# Patient Record
Sex: Female | Born: 1975 | Race: White | Hispanic: No | Smoking: Current every day smoker
Health system: Southern US, Community
[De-identification: ages and names within clinical notes are randomized; demographics above are authoritative.]

## PROBLEM LIST (undated history)

## (undated) HISTORY — PX: CHOLECYSTECTOMY: SHX55

## (undated) HISTORY — PX: BREAST ENHANCEMENT SURGERY: SHX7

---

## 2010-10-16 ENCOUNTER — Other Ambulatory Visit: Payer: Self-pay | Admitting: Occupational Medicine

## 2010-10-16 ENCOUNTER — Ambulatory Visit
Admission: RE | Admit: 2010-10-16 | Discharge: 2010-10-16 | Disposition: A | Discharge status: Home / Self Care | Payer: No Typology Code available for payment source | Source: Ambulatory Visit | Attending: Occupational Medicine | Admitting: Occupational Medicine

## 2010-10-16 DIAGNOSIS — Y990 Civilian activity done for income or pay: Secondary | ICD-10-CM

## 2010-10-16 DIAGNOSIS — R52 Pain, unspecified: Secondary | ICD-10-CM

## 2010-10-16 DIAGNOSIS — W19XXXA Unspecified fall, initial encounter: Secondary | ICD-10-CM

## 2010-10-21 ENCOUNTER — Ambulatory Visit
Admission: RE | Admit: 2010-10-21 | Discharge: 2010-10-21 | Disposition: A | Discharge status: Home / Self Care | Payer: No Typology Code available for payment source | Source: Ambulatory Visit | Attending: Occupational Medicine | Admitting: Occupational Medicine

## 2010-10-21 ENCOUNTER — Other Ambulatory Visit: Payer: Self-pay | Admitting: Occupational Medicine

## 2010-10-21 DIAGNOSIS — Z569 Unspecified problems related to employment: Secondary | ICD-10-CM

## 2010-10-21 DIAGNOSIS — M545 Low back pain, unspecified: Secondary | ICD-10-CM

## 2010-12-10 ENCOUNTER — Other Ambulatory Visit: Payer: Self-pay | Admitting: Orthopedic Surgery

## 2010-12-10 DIAGNOSIS — M5410 Radiculopathy, site unspecified: Secondary | ICD-10-CM

## 2010-12-28 ENCOUNTER — Ambulatory Visit
Admission: RE | Admit: 2010-12-28 | Discharge: 2010-12-28 | Disposition: A | Discharge status: Home / Self Care | Payer: No Typology Code available for payment source | Source: Ambulatory Visit | Attending: Orthopedic Surgery | Admitting: Orthopedic Surgery

## 2010-12-28 DIAGNOSIS — M5410 Radiculopathy, site unspecified: Secondary | ICD-10-CM

## 2012-03-15 IMAGING — CR DG LUMBAR SPINE COMPLETE 4+V
5 series · 5 of 5 positions shown · non-contrast
Comparison: None.

CLINICAL DATA: Fall on [DATE], low back pain

LUMBAR SPINE - COMPLETE 4+ VIEW

[view not recorded (1 of 5)]
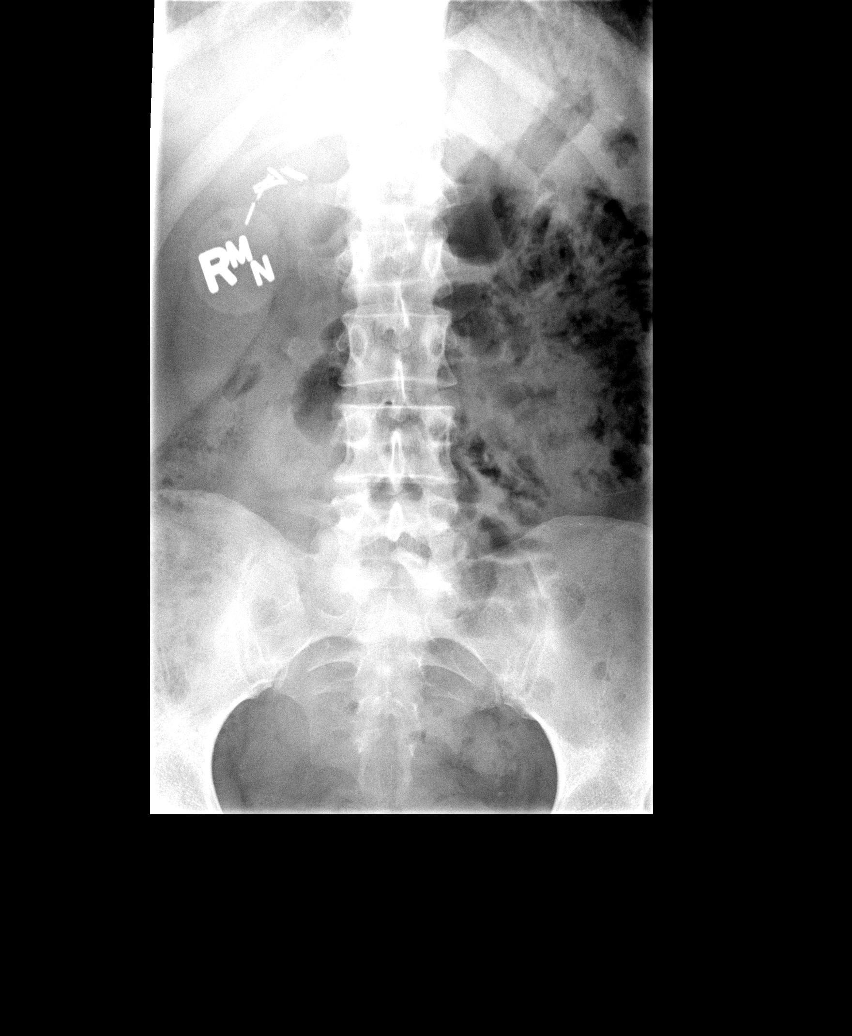

[view not recorded (2 of 5)]
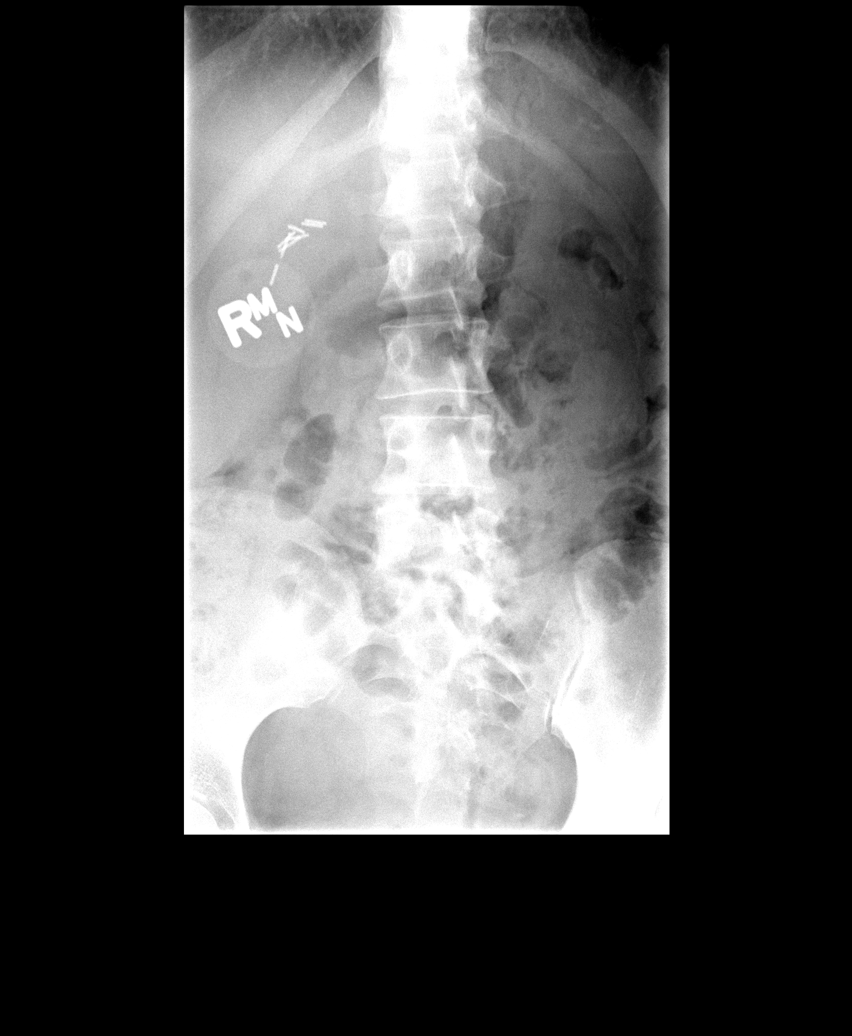

[view not recorded (3 of 5)]
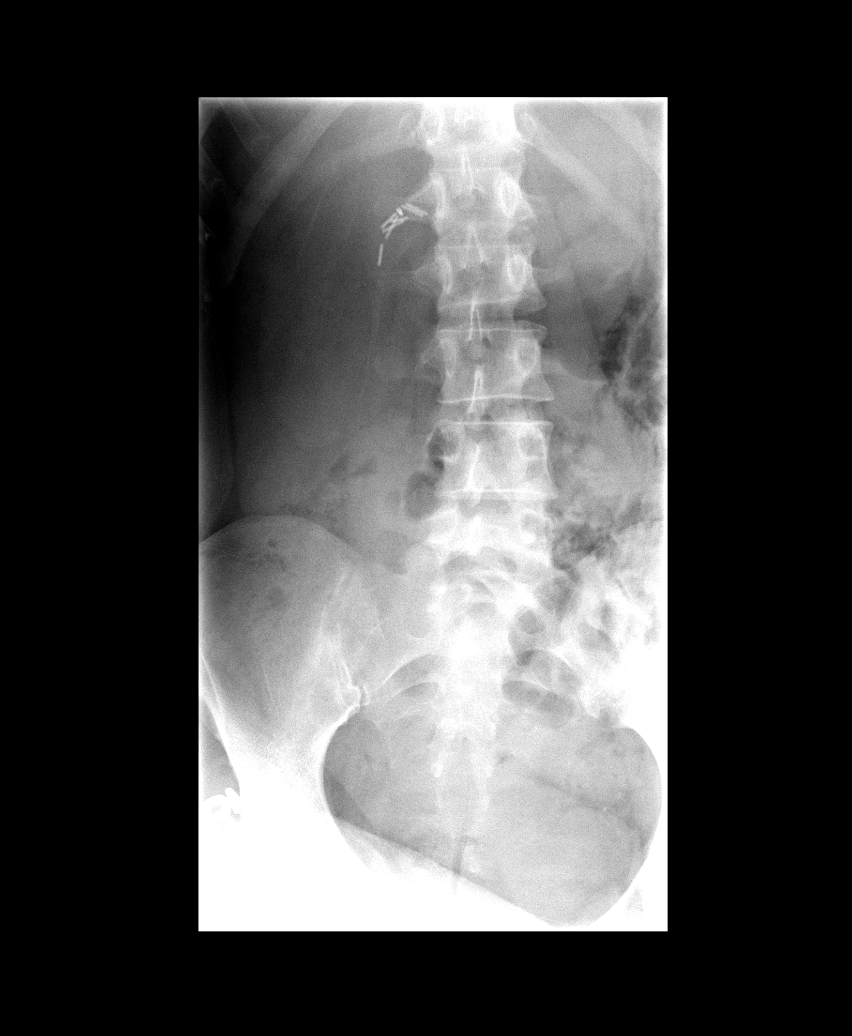

[view not recorded (4 of 5)]
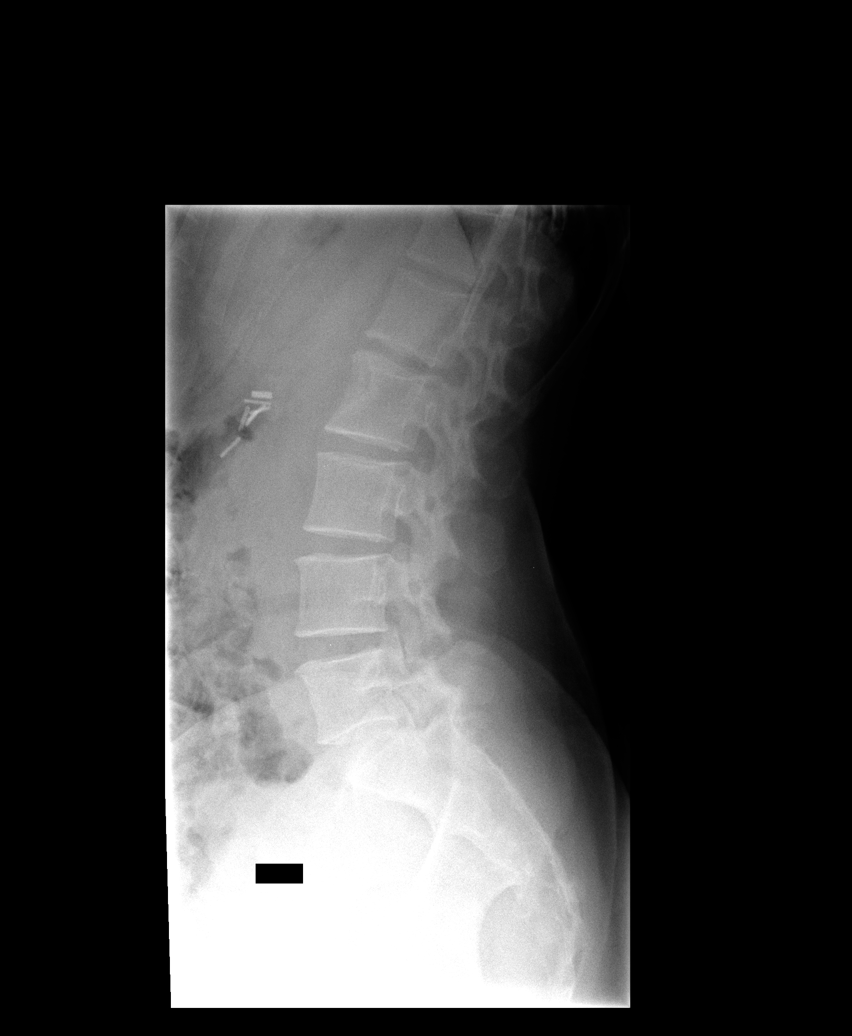

[view not recorded (5 of 5)]
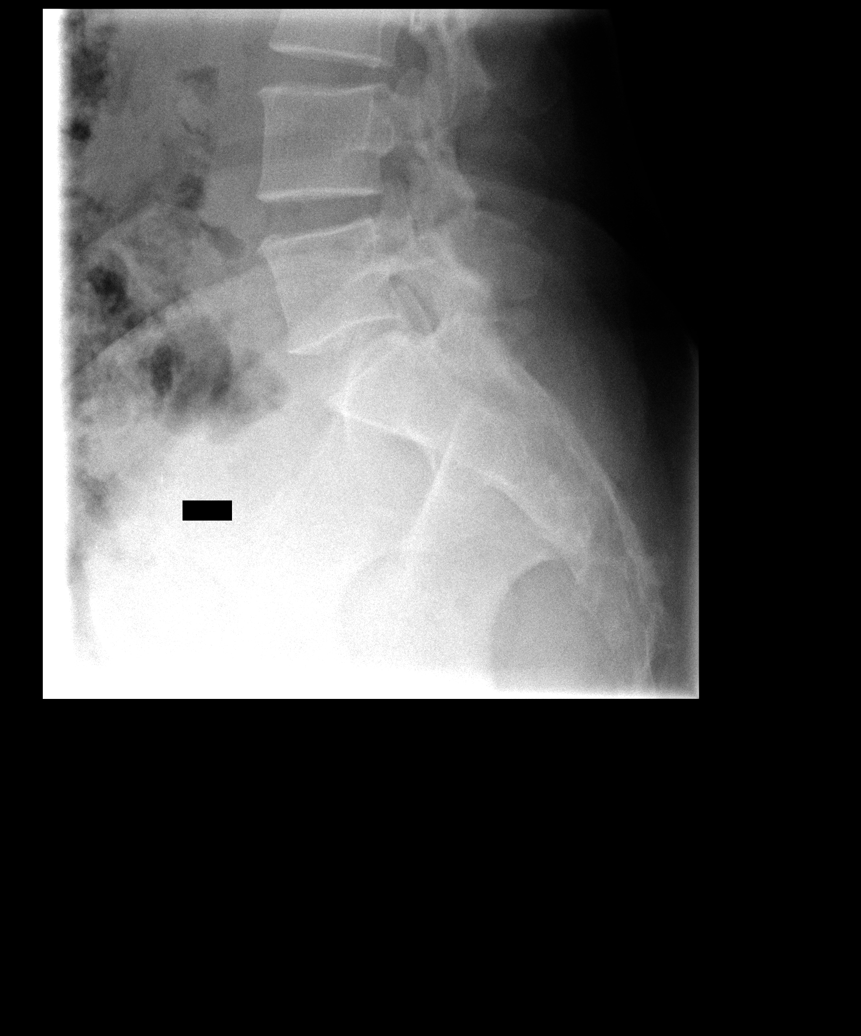

[5 of 5 positions shown; findings below may reference images not displayed]

FINDINGS: Five lumbar-type vertebral bodies.

No evidence of acute fracture or dislocation.

Mild superior endplate changes at L2.  Mild degenerative changes L5-
S1 with possible pars defect.

Cholecystectomy clips.
IMPRESSION: No evidence of acute fracture or dislocation.

Degenerative changes with possible pars defect at L5-S1.

## 2014-03-25 ENCOUNTER — Emergency Department (HOSPITAL_COMMUNITY)
Admission: EM | Admit: 2014-03-25 | Discharge: 2014-03-25 | Disposition: A | Discharge status: Home / Self Care | Payer: No Typology Code available for payment source | Attending: Emergency Medicine | Admitting: Emergency Medicine

## 2014-03-25 ENCOUNTER — Encounter (HOSPITAL_COMMUNITY): Payer: Self-pay | Admitting: Emergency Medicine

## 2014-03-25 DIAGNOSIS — Y998 Other external cause status: Secondary | ICD-10-CM | POA: Insufficient documentation

## 2014-03-25 DIAGNOSIS — Z720 Tobacco use: Secondary | ICD-10-CM | POA: Diagnosis not present

## 2014-03-25 DIAGNOSIS — Y9289 Other specified places as the place of occurrence of the external cause: Secondary | ICD-10-CM | POA: Insufficient documentation

## 2014-03-25 DIAGNOSIS — T7421XA Adult sexual abuse, confirmed, initial encounter: Secondary | ICD-10-CM | POA: Insufficient documentation

## 2014-03-25 DIAGNOSIS — Y9389 Activity, other specified: Secondary | ICD-10-CM | POA: Diagnosis not present

## 2014-03-25 NOTE — ED Notes (Signed)
Sane nurse in with pt at this time.
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
## 2014-03-25 NOTE — Discharge Instructions (Signed)
Sexual Assault or Rape  Sexual assault is any sexual activity that a person is forced, threatened, or coerced into participating in. It may or may not involve physical contact. You are being sexually abused if you are forced to have sexual contact of any kind. Sexual assault is called rape if penetration has occurred (vaginal, oral, or anal). Many times, sexual assaults are committed by a friend, relative, or associate. Sexual assault and rape are never the victim's fault.   Sexual assault can result in various health problems for the person who was assaulted. Some of these problems include:  · Physical injuries in the genital area or other areas of the body.  · Risk of unwanted pregnancy.  · Risk of sexually transmitted infections (STIs).  · Psychological problems such as anxiety, depression, or posttraumatic stress disorder.  WHAT STEPS SHOULD BE TAKEN AFTER A SEXUAL ASSAULT?  If you have been sexually assaulted, you should take the following steps as soon as possible:  · Go to a safe area as quickly as possible and call your local emergency services (911 in [place] Get away from the area where you have been attacked.    · Do not wash, shower, comb your hair, or clean any part of your body.    · Do not change your clothes.    · Do not remove or touch anything in the area where you were assaulted.    · Go to an emergency room for a complete physical exam. Get the necessary tests to protect yourself from STIs or pregnancy. You may be treated for an STI even if no signs of one are present. Emergency contraceptive medicines are also available to help prevent pregnancy, if this is desired. You may need to be examined by a specially trained health care provider.  · Have the health care provider collect evidence during the exam, even if you are not sure if you will file a report with the police.  · Find out how to file the correct papers with the authorities. This is important for all assaults, even if they were committed  by a family member or friend.  · Find out where you can get additional help and support, such as a local rape crisis center.  · Follow up with your health care provider as directed.    HOW CAN YOU REDUCE THE CHANCES OF SEXUAL ASSAULT?  Take the following steps to help reduce your chances of being sexually assaulted:  · Consider carrying mace or pepper spray for protection against an attacker.    · Consider taking a self-defense course.  · Do not try to fight off an attacker if he or she has a gun or knife.    · Be aware of your surroundings, what is happening around you, and who might be there.    · Be assertive, trust your instincts, and walk with confidence and direction.  · Be careful not to drink too much alcohol or use other intoxicants. These can reduce your ability to fight off an assault.  · Always lock your doors and windows. Be sure to have high-quality locks for your home.    · Do not let people enter your house if you do not know them.    · Get a home security system that has a siren if you are able.    · Protect the keys to your house and car. Do not lend them out. Do not put your name and address on them. If you lose them, get your locks changed.    · Always   lock your car and have your key ready to open the door before approaching the car.    · Park in a well-lit and busy area.  · Plan your driving routes so that you travel on well-lit and frequently used streets.   · Keep your car serviced. Always have at least half a tank of gas in it.    · Do not go into isolated areas alone. This includes open garages, empty buildings or offices, or public laundry rooms.    · Do not walk or jog alone, especially when it is dark.    · Never hitchhike.    · If your car breaks down, call the police for help on your cell phone and stay inside the car with your doors locked and windows up.    · If you are being followed, go to a busy area and call for help.    · If you are stopped by a police officer, especially one in  an unmarked police car, keep your door locked. Do not put your window down all the way. Ask the officer to show you identification first.    · Be aware of "date rape drugs" that can be placed in a drink when you are not looking. These drugs can make you unable to fight off an assault.  FOR MORE INFORMATION  · Office on Women's Health, [place] Department of Health and Human Services: www.womenshealth.gov/violence-against-women/types-of-violence/sexual-assault-and-abuse.html  · National Sexual Assault Hotline: 1-800-656-HOPE (4673)  · National Domestic Violence Hotline: 1-800-799-SAFE (7233) or www.thehotline.org  Document Released: 01/04/2000 Document Revised: 09/08/2012 Document Reviewed: 06/09/2012  ExitCare® Patient Information ©2015 [employer]. This information is not intended to replace advice given to you by your health care provider. Make sure you discuss any questions you have with your health care provider.
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
## 2014-03-25 NOTE — ED Notes (Signed)
Sane nurse notified that pt has been medically cleared.  Sane nurse st's she will be here in approx. 45 minutes.  Pt made aware of same

## 2014-03-25 NOTE — ED Notes (Signed)
Contact sane nurse after pt has been medically cleared.

## 2014-04-10 ENCOUNTER — Encounter: Payer: No Typology Code available for payment source | Admitting: Obstetrics & Gynecology

## 2018-09-09 ENCOUNTER — Other Ambulatory Visit: Payer: Self-pay

## 2018-09-09 DIAGNOSIS — Z20822 Contact with and (suspected) exposure to covid-19: Secondary | ICD-10-CM

## 2018-09-10 LAB — NOVEL CORONAVIRUS, NAA: SARS-CoV-2, NAA: NOT DETECTED

## 2018-09-13 ENCOUNTER — Telehealth: Payer: Self-pay | Admitting: General Practice

## 2018-09-13 NOTE — Telephone Encounter (Signed)
Negative COVID results given. Patient results "NOT Detected." Caller expressed understanding. ° °
